# Patient Record
Sex: Female | Born: 1965 | Race: White | Hispanic: No | Marital: Married | Smoking: Never smoker
Health system: Southern US, Community
[De-identification: ages and names within clinical notes are randomized; demographics above are authoritative.]

## PROBLEM LIST (undated history)

## (undated) HISTORY — PX: HERNIA REPAIR: SHX51

---

## 2021-04-27 ENCOUNTER — Other Ambulatory Visit: Payer: Self-pay

## 2021-04-27 ENCOUNTER — Ambulatory Visit (HOSPITAL_COMMUNITY)
Admission: EM | Admit: 2021-04-27 | Discharge: 2021-04-27 | Disposition: A | Discharge status: Home / Self Care | Payer: Managed Care, Other (non HMO) | Attending: Physician Assistant | Admitting: Physician Assistant

## 2021-04-27 ENCOUNTER — Ambulatory Visit (INDEPENDENT_AMBULATORY_CARE_PROVIDER_SITE_OTHER): Payer: Managed Care, Other (non HMO)

## 2021-04-27 ENCOUNTER — Encounter (HOSPITAL_COMMUNITY): Payer: Self-pay

## 2021-04-27 DIAGNOSIS — S99911A Unspecified injury of right ankle, initial encounter: Secondary | ICD-10-CM | POA: Diagnosis not present

## 2021-04-27 DIAGNOSIS — S99921A Unspecified injury of right foot, initial encounter: Secondary | ICD-10-CM

## 2021-04-27 DIAGNOSIS — M79671 Pain in right foot: Secondary | ICD-10-CM

## 2021-04-27 DIAGNOSIS — M25571 Pain in right ankle and joints of right foot: Secondary | ICD-10-CM | POA: Diagnosis not present

## 2021-04-27 NOTE — ED Provider Notes (Signed)
[facility]    CSN: [id number] Arrival date & time: 04/27/21  1541      History   Chief Complaint Chief Complaint  Patient presents with   Ankle Pain    HPI Chosen Garron is a 55 y.o. female.   Patient presents today for evaluation of right foot swelling and pain after twisting her ankle earlier today.  She reports that initially she tried to take ibuprofen, and was able to fall asleep, but when she awoke she continued to have pain with weightbearing.  She denies any history of injury to this foot or ankle.  She denies any numbness or tingling.  The history is provided by the patient.  Ankle Pain Associated symptoms: no fever    History reviewed. No pertinent past medical history.  There are no problems to display for this patient.   History reviewed. No pertinent surgical history.  OB History   No obstetric history on file.      Home Medications    Prior to Admission medications   Not on File    Family History History reviewed. No pertinent family history.  Social History     Allergies   Patient has no known allergies.   Review of Systems Review of Systems  Constitutional:  Negative for chills and fever.  Eyes:  Negative for discharge and redness.  Musculoskeletal:  Positive for joint swelling.  Skin:  Positive for color change.  Neurological:  Negative for numbness.    Physical Exam Triage Vital Signs ED Triage Vitals  Enc Vitals Group     BP 04/27/21 1622 122/80     Pulse Rate 04/27/21 1622 73     Resp 04/27/21 1622 18     Temp 04/27/21 1622 98.9 F (37.2 C)     Temp Source 04/27/21 1622 Oral     SpO2 04/27/21 1622 98 %     Weight --      Height --      Head Circumference --      Peak Flow --      Pain Score 04/27/21 1623 7     Pain Loc --      Pain Edu? --      Excl. in GC? --    No data found.  Updated Vital Signs BP 122/80 (BP Location: Right Arm)   Pulse 73   Temp 98.9 F (37.2 C) (Oral)   Resp 18   SpO2  98%      Physical Exam Vitals and nursing note reviewed.  Constitutional:      General: She is not in acute distress.    Appearance: Normal appearance. She is not ill-appearing.  HENT:     Head: Normocephalic and atraumatic.     Nose: Nose normal.  Eyes:     Conjunctiva/sclera: Conjunctivae normal.  Cardiovascular:     Rate and Rhythm: Normal rate.  Pulmonary:     Effort: Pulmonary effort is normal.  Musculoskeletal:     Comments: Full ROM of right ankle.  Significant swelling noted to proximal lateral foot.  Some bruising noted to this area as well.  Normal ROM of right toes.  Skin:    General: Skin is warm.     Capillary Refill: Normal cap refill to right toes    Findings: Bruising present.  Neurological:     Mental Status: She is alert.     Comments: Sensation intact to right toes distally  Psychiatric:        Mood  and Affect: Mood normal.        Thought Content: Thought content normal.     UC Treatments / Results  Labs (all labs ordered are listed, but only abnormal results are displayed) Labs Reviewed - No data to display  EKG   Radiology DG Ankle Complete Right  Result Date: 04/27/2021 CLINICAL DATA:  Right foot pain. Foot and ankle pain. Twisted ankle getting out of the car this afternoon. EXAM: RIGHT ANKLE - COMPLETE 3+ VIEW COMPARISON:  None. FINDINGS: Cortical irregularity involving the lateral base of the fifth metatarsal is seen only on a single view. This is suspicious for acute fracture. No additional fracture of the ankle. The ankle mortise is preserved. There is no ankle joint effusion. Moderate plantar calcaneal spur and Achilles tendon enthesophyte. Mild soft tissue edema. IMPRESSION: Cortical irregularity involving the lateral base of the fifth metatarsal is seen only on a single view, suspicious for acute fracture. Recommend correlation for focal tenderness. No additional fracture of the ankle. Electronically Signed   By: Narda Rutherford M.D.   On:  04/27/2021 17:08   DG Foot Complete Right  Result Date: 04/27/2021 CLINICAL DATA:  Right foot pain. Foot and ankle pain. Twisted ankle getting out of the car this afternoon EXAM: RIGHT FOOT COMPLETE - 3+ VIEW COMPARISON:  None. FINDINGS: Cortical irregularity involving the base of the fifth metatarsal is seen only on the AP view. This is suspicious for acute fracture. No other acute fracture of the foot. Moderate hallux valgus and degenerative change of the first metatarsal phalangeal joint. There is soft tissue thickening involving the medial aspect of the first MTP. Surgical clips overlie the dorsum of the 1st-2nd interspace. Soft tissue edema is seen. IMPRESSION: 1. Cortical irregularity involving the base of the fifth metatarsal is seen only on the AP view, suspicious for acute fracture. Recommend correlation with point tenderness. 2. Moderate hallux valgus and degenerative change of the first metatarsophalangeal joint. Electronically Signed   By: Narda Rutherford M.D.   On: 04/27/2021 17:09    Procedures Procedures (including critical care time)  Medications Ordered in UC Medications - No data to display  Initial Impression / Assessment and Plan / UC Course  I have reviewed the triage vital signs and the nursing notes.  Pertinent labs & imaging results that were available during my care of the patient were reviewed by me and considered in my medical decision making (see chart for details).  Discussed concerns for possible fracture given x-ray results.  Recommended nonweightbearing, and crutches and postop shoe provided.  Recommended follow-up with Ortho, and contact information given for same.   Final Clinical Impressions(s) / UC Diagnoses   Final diagnoses:  Right foot injury, initial encounter  Right ankle injury, initial encounter     Discharge Instructions      Keep foot elevated as much as possible.  Okay to continue to apply ice intermittently today.  Wear postop shoe and use  crutches to avoid weightbearing.  Please call Ortho to schedule appointment early next week.     ED Prescriptions   None    PDMP not reviewed this encounter.   Tomi Bamberger, PA-C 04/27/21 1722

## 2021-04-27 NOTE — Discharge Instructions (Addendum)
Keep foot elevated as much as possible.  Okay to continue to apply ice intermittently today.  Wear postop shoe and use crutches to avoid weightbearing.  Please call Ortho to schedule appointment early next week.

## 2021-04-27 NOTE — ED Triage Notes (Signed)
Pt present right ankle pain, pt states that she twisted her ankle getting out the car this after noon. Pt states she is unable to bear any weight on the ankle.

## 2022-01-31 ENCOUNTER — Ambulatory Visit (INDEPENDENT_AMBULATORY_CARE_PROVIDER_SITE_OTHER): Payer: Managed Care, Other (non HMO) | Admitting: Radiology

## 2022-01-31 ENCOUNTER — Encounter: Payer: Self-pay | Admitting: Radiology

## 2022-01-31 VITALS — BP 120/80 | Ht 64.25 in | Wt 171.0 lb

## 2022-01-31 DIAGNOSIS — N6342 Unspecified lump in left breast, subareolar: Secondary | ICD-10-CM | POA: Diagnosis not present

## 2022-01-31 NOTE — Progress Notes (Signed)
   Jane Ross 27-Oct-1965 [id number]   History:  56 y.o. G1P1 presents with complaints of left breast pain x 1 week. Pinpoint and extends out at times. Has a history of fibrocystic breasts. Lives in [place], returning next week and has a mammogram scheduled there.   Gynecologic History No LMP recorded. Patient is postmenopausal.   Last mammogram: 2021. Results were: normal  Obstetric History OB History  Gravida Para Term Preterm AB Living  1 1          SAB IAB Ectopic Multiple Live Births               # Outcome Date GA Lbr Len/2nd Weight Sex Delivery Anes PTL Lv  1 Para              The following portions of the patient's history were reviewed and updated as appropriate: allergies, current medications, past family history, past medical history, past social history, past surgical history, and problem list.  Review of Systems Pertinent items noted in HPI and remainder of comprehensive ROS otherwise negative.   Past medical history, past surgical history, family history and social history were all reviewed and documented in the EPIC chart.   Exam:  Vitals:   01/31/22 0932  BP: 120/80  Weight: 171 lb (77.6 kg)  Height: 5' 4.25" (1.632 m)   Body mass index is 29.12 kg/m.  General appearance:  Normal Thyroid:  Symmetrical, normal in size, without palpable masses or nodularity. Respiratory  Auscultation:  Clear without wheezing or rhonchi Cardiovascular  Auscultation:  Regular rate, without rubs, murmurs or gallops  Edema/varicosities:  Not grossly evident Breasts: breasts appear normal, no suspicious masses, no skin or nipple changes or axillary nodes, right breast normal without mass, skin or nipple changes or axillary nodes, abnormal mass palpable left breast 6 oclock, adjacent to areola, mobile and tender.   Patient informed chaperone available to be present for breast exam. Patient has requested no chaperone to be present.   Assessment/Plan:   1. Subareolar mass  of left breast Declines referral for diagnositc mammo and ultrasound Will keep appointment in [place] If cystic advised vit 3 400iu twice daily may help reduce inflammation    Jonas Goh B WHNP-BC 9:48 AM 01/31/2022

## 2023-06-09 IMAGING — DX DG ANKLE COMPLETE 3+V*R*
3 series · 3 of 3 positions shown · non-contrast
Comparison: None.

CLINICAL DATA: Right foot pain. Foot and ankle pain. Twisted ankle
getting out of the car this afternoon.

EXAM:
RIGHT ANKLE - COMPLETE 3+ VIEW

[ankle ap]
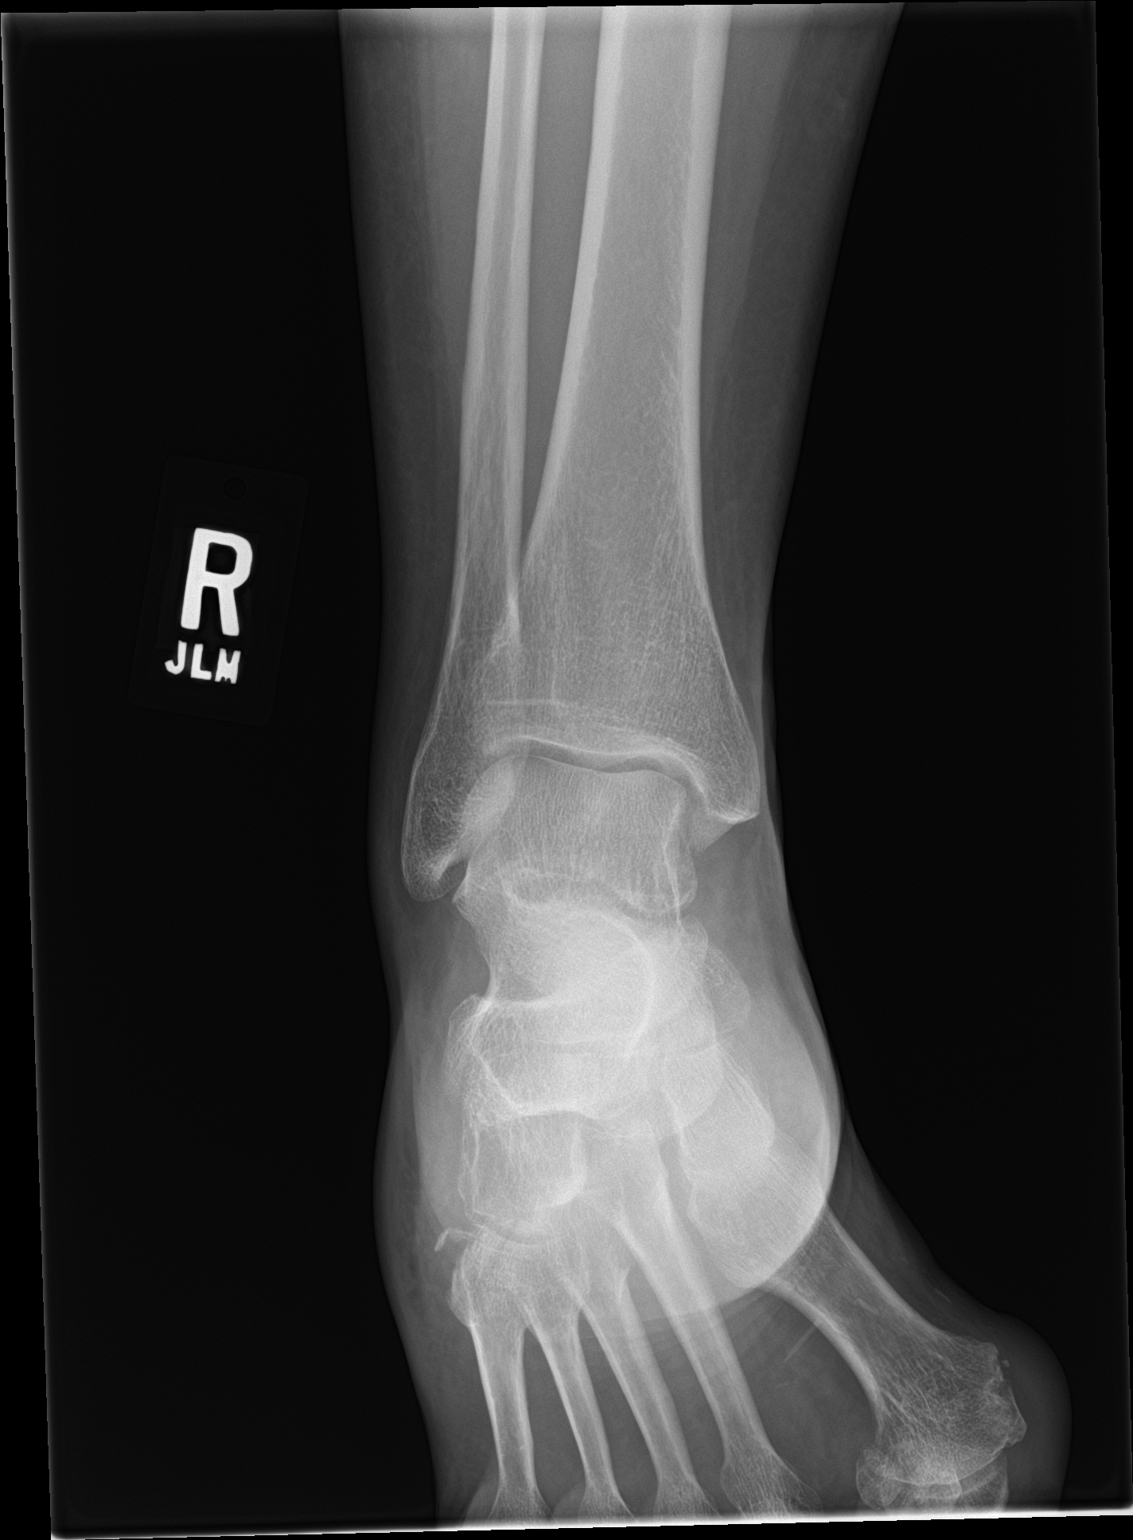

[ankle obl]
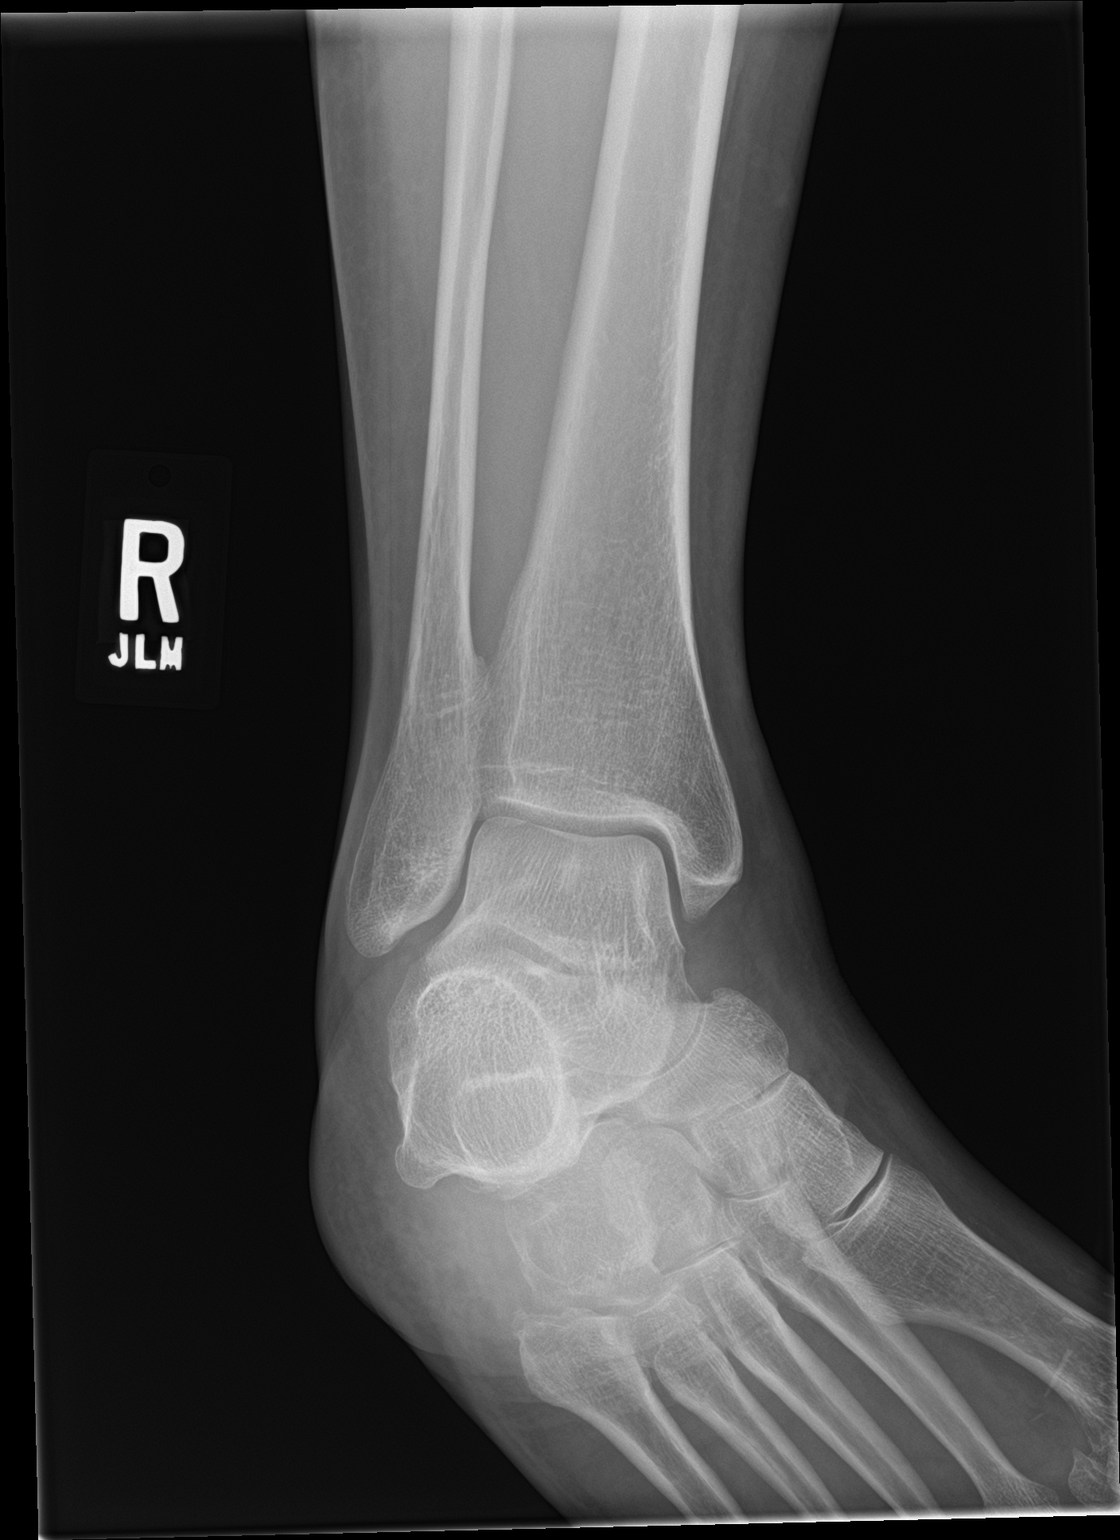

[ankle lat]
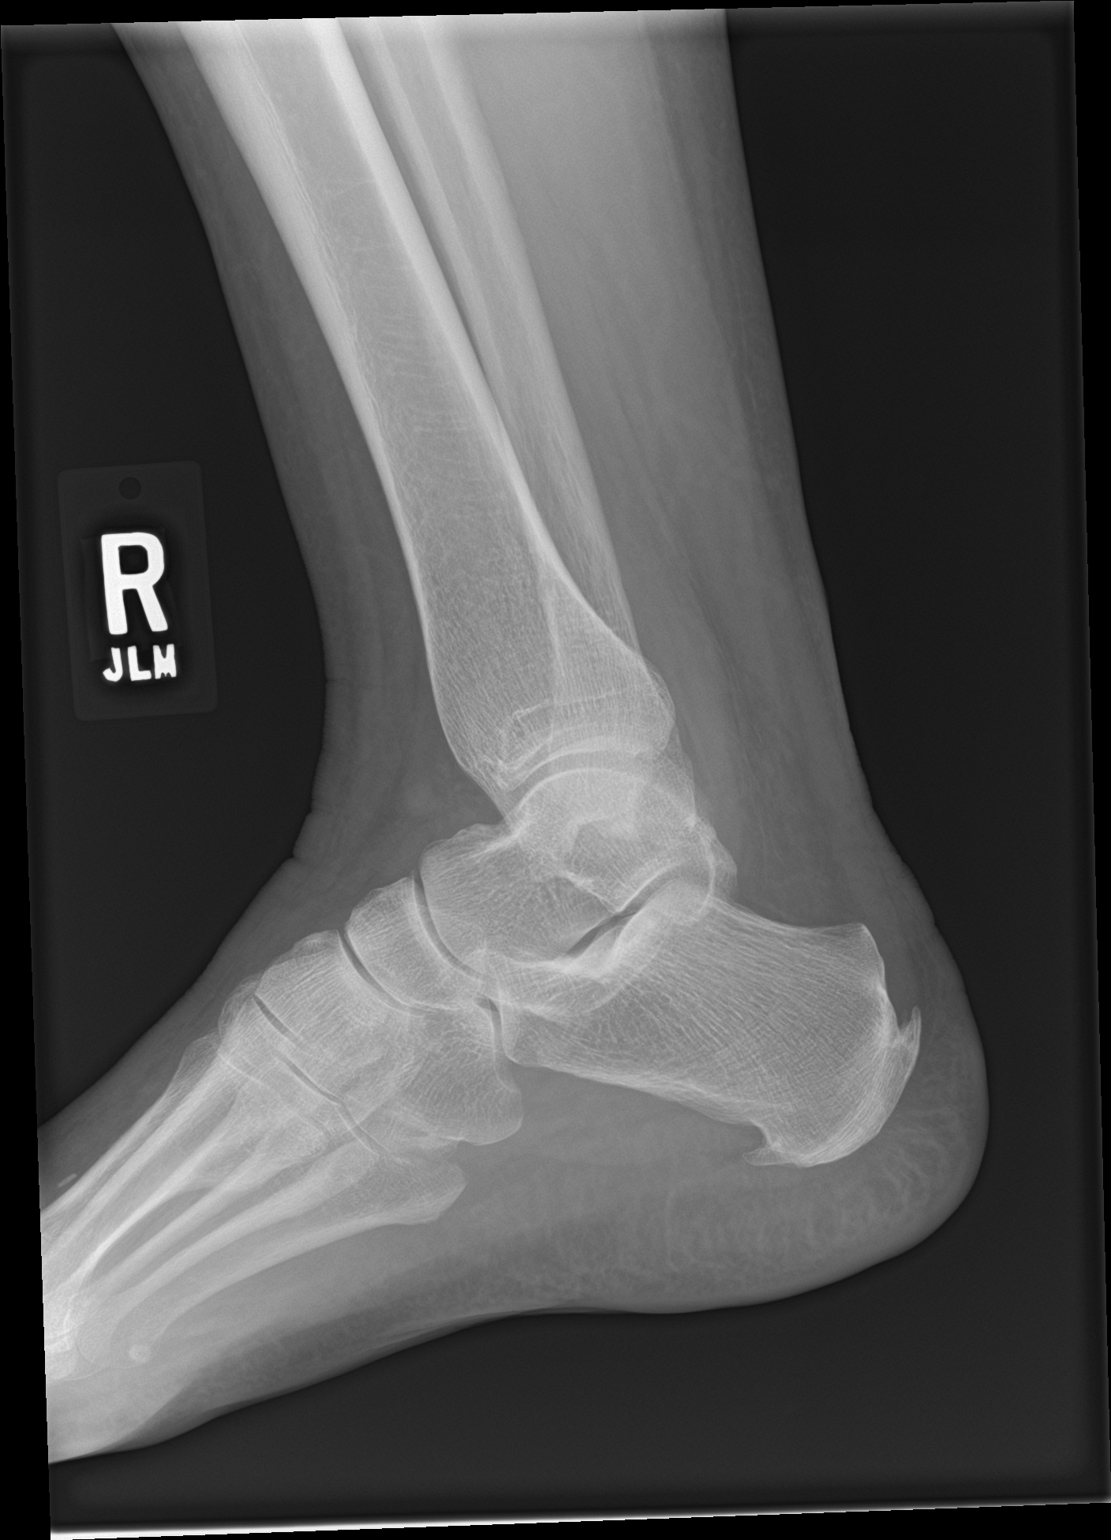

[3 of 3 positions shown; findings below may reference images not displayed]

FINDINGS: Cortical irregularity involving the lateral base of the fifth
metatarsal is seen only on a single view. This is suspicious for
acute fracture. No additional fracture of the ankle. The ankle
mortise is preserved. There is no ankle joint effusion. Moderate
plantar calcaneal spur and Achilles tendon enthesophyte. Mild soft
tissue edema.
IMPRESSION: Cortical irregularity involving the lateral base of the fifth
metatarsal is seen only on a single view, suspicious for acute
fracture. Recommend correlation for focal tenderness. No additional
fracture of the ankle.

## 2023-06-09 IMAGING — DX DG FOOT COMPLETE 3+V*R*
3 series · 3 of 3 positions shown · non-contrast
Comparison: None.

CLINICAL DATA: Right foot pain. Foot and ankle pain. Twisted ankle
getting out of the car this afternoon

EXAM:
RIGHT FOOT COMPLETE - 3+ VIEW

[foot ap]
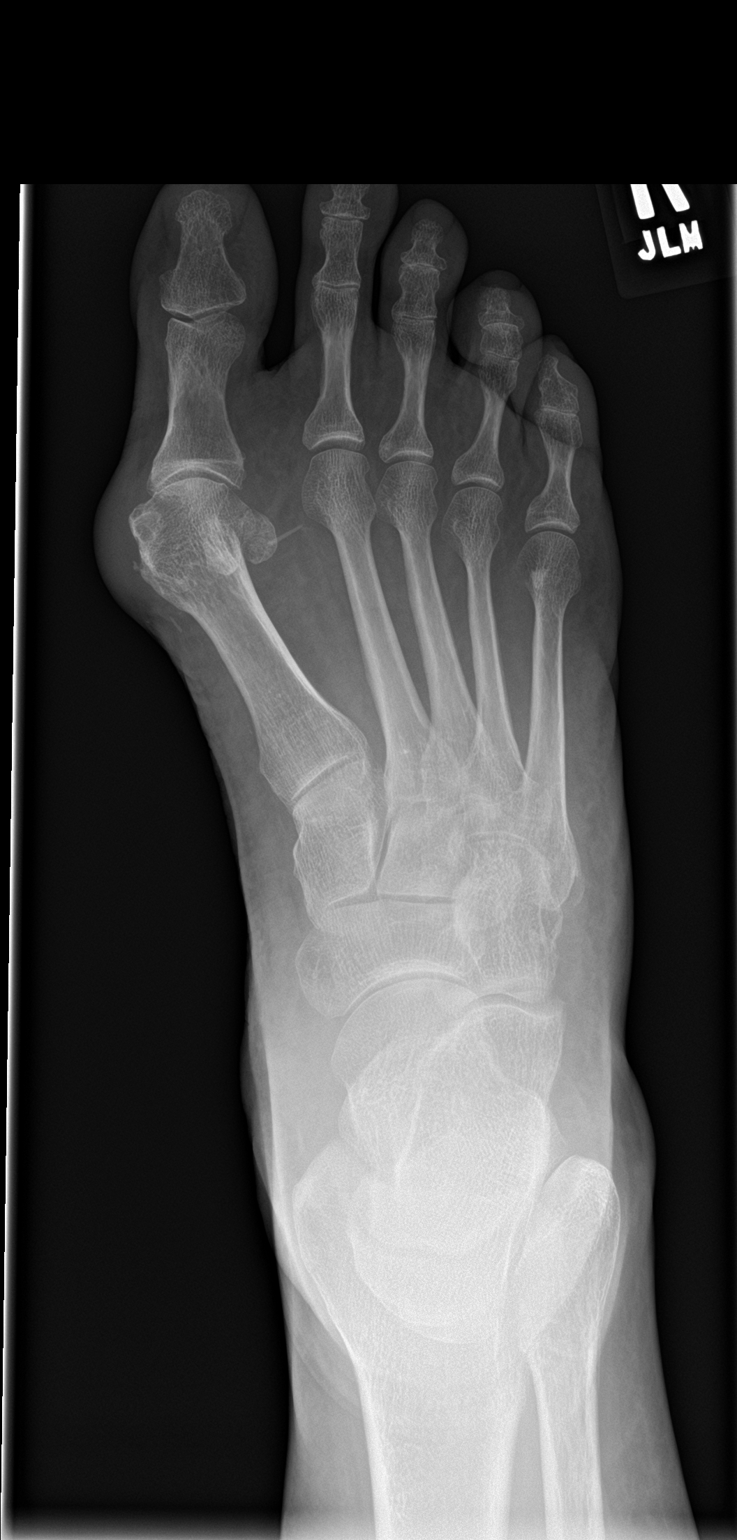

[foot obl]
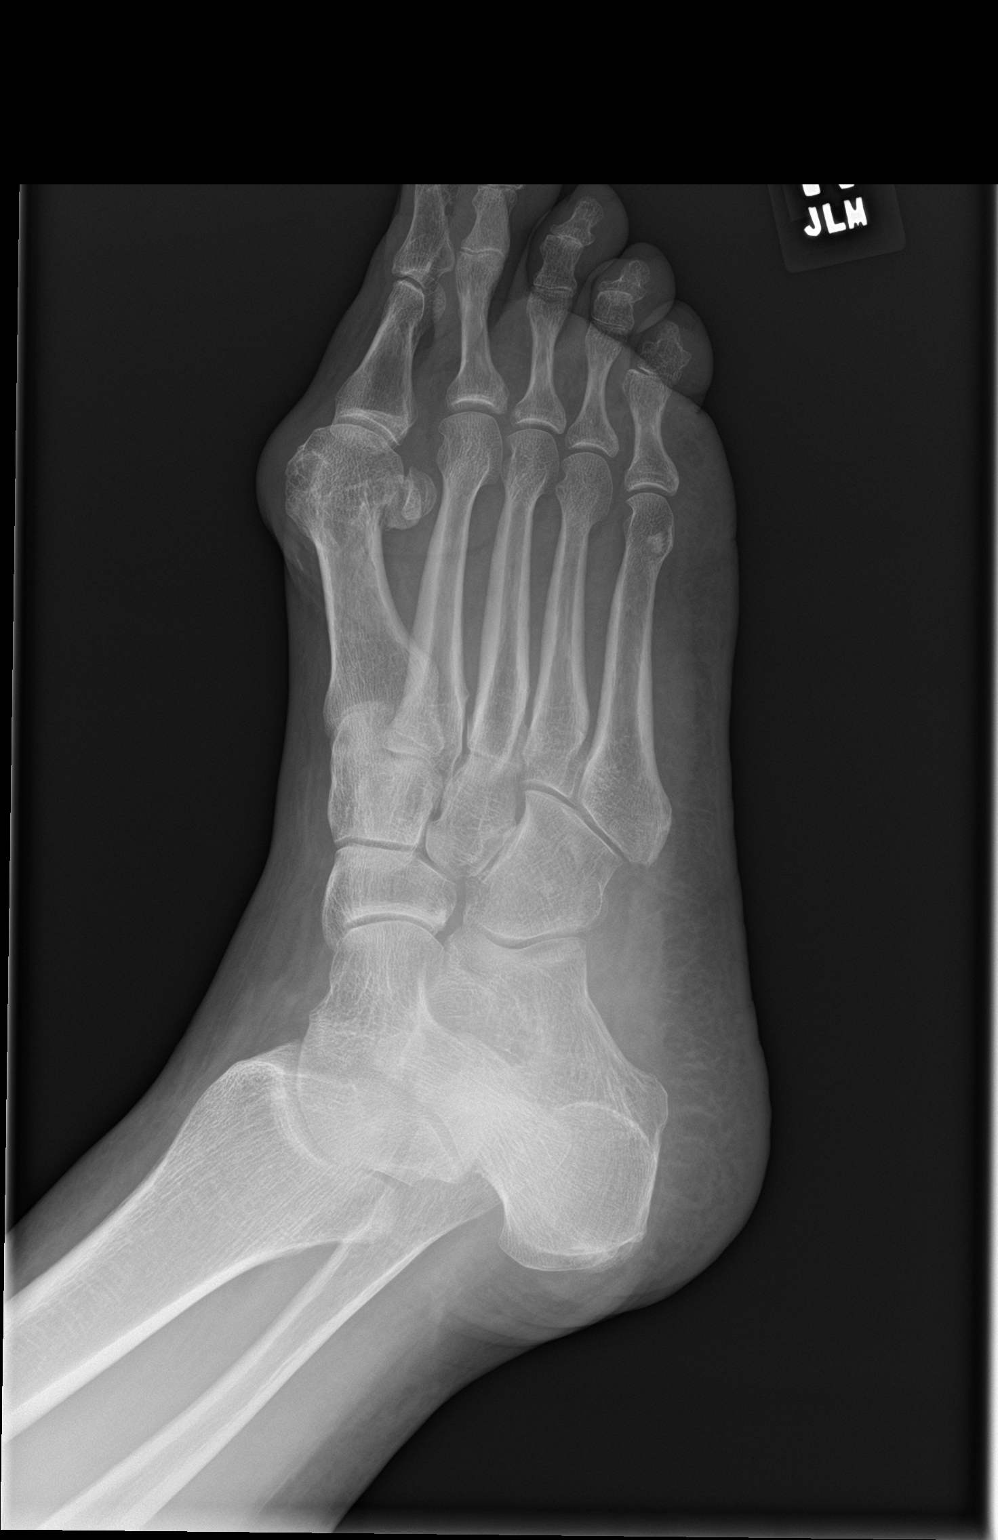

[foot lat]
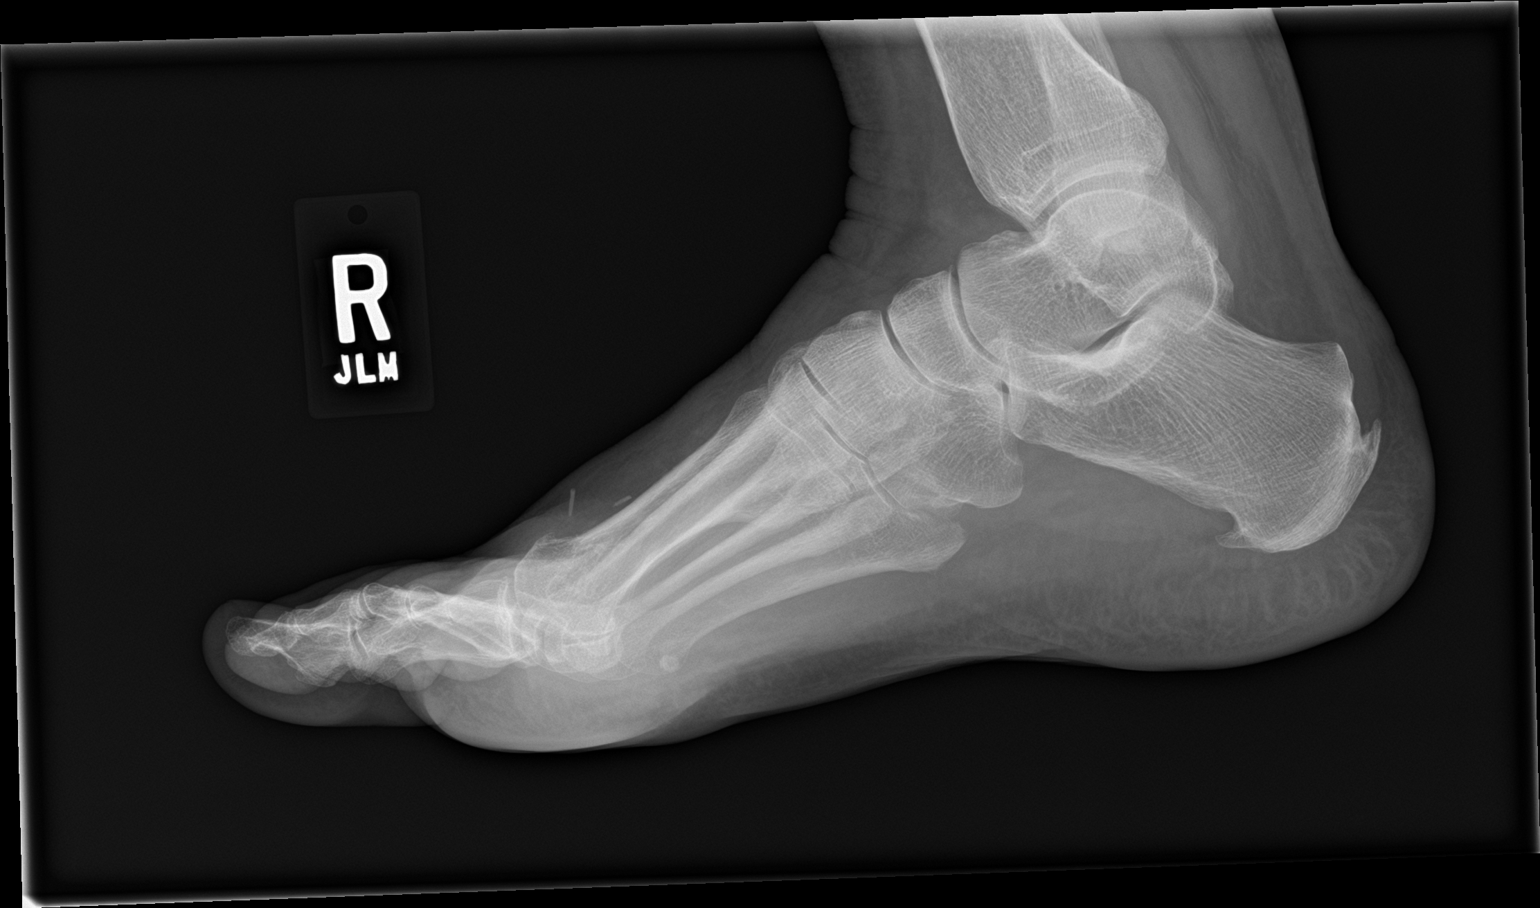

[3 of 3 positions shown; findings below may reference images not displayed]

FINDINGS: Cortical irregularity involving the base of the fifth metatarsal is
seen only on the AP view. This is suspicious for acute fracture. No
other acute fracture of the foot. Moderate hallux valgus and
degenerative change of the first metatarsal phalangeal joint. There
is soft tissue thickening involving the medial aspect of the first
MTP. Surgical clips overlie the dorsum of the 6st-2nd interspace.
Soft tissue edema is seen.
IMPRESSION: 1. Cortical irregularity involving the base of the fifth metatarsal
is seen only on the AP view, suspicious for acute fracture.
Recommend correlation with point tenderness.
2. Moderate hallux valgus and degenerative change of the first
metatarsophalangeal joint.
